# Patient Record
Sex: Male | Born: 1965 | Race: White | Hispanic: No | Marital: Single | Smoking: Never smoker
Health system: Southern US, Community
[De-identification: ages and names within clinical notes are randomized; demographics above are authoritative.]

## PROBLEM LIST (undated history)

## (undated) DIAGNOSIS — Z86010 Personal history of colonic polyps: Secondary | ICD-10-CM

## (undated) DIAGNOSIS — I10 Essential (primary) hypertension: Secondary | ICD-10-CM

## (undated) HISTORY — DX: Essential (primary) hypertension: I10

## (undated) HISTORY — PX: SHOULDER SURGERY: SHX246

## (undated) HISTORY — PX: COLONOSCOPY: SHX174

---
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
## 2018-09-16 ENCOUNTER — Telehealth: Payer: Self-pay | Admitting: Internal Medicine

## 2018-09-16 NOTE — Telephone Encounter (Signed)
I have looked at the records and timing of next colonoscopy depends upon family history    Please find out who in his family had colon cancer and at what age was it diagnosed (as best he can tell us)  Want to know as specific as he can but if cannot try to find out if relative was younger than 58's, in 66's or older than 51's  Thanks

## 2018-09-16 NOTE — Telephone Encounter (Signed)
Dr. Carlean Purl, pt was referred to you by his sister who is also your pt.  Pt has a family h/o colon cancer.  Previous colonoscopy report from [facility] from 2014 will be sent to you for review.

## 2018-09-17 ENCOUNTER — Encounter: Payer: Self-pay | Admitting: Internal Medicine

## 2018-09-17 DIAGNOSIS — Z800 Family history of malignant neoplasm of digestive organs: Secondary | ICD-10-CM | POA: Insufficient documentation

## 2018-09-17 NOTE — Telephone Encounter (Signed)
OK Schedule for direct colonoscopy please

## 2018-09-17 NOTE — Telephone Encounter (Signed)
I called the patient and he said that only his mother was diagnosed in her late 6's.

## 2018-09-26 ENCOUNTER — Ambulatory Visit: Payer: BC Managed Care – PPO | Admitting: *Deleted

## 2018-09-26 ENCOUNTER — Other Ambulatory Visit: Payer: Self-pay

## 2018-09-26 VITALS — Ht 73.0 in | Wt 230.0 lb

## 2018-09-26 DIAGNOSIS — Z800 Family history of malignant neoplasm of digestive organs: Secondary | ICD-10-CM

## 2018-09-26 NOTE — Progress Notes (Signed)
No egg or soy allergy known to patient  No issues with past sedation with any surgeries  or procedures, no intubation problems  No diet pills per patient No home 02 use per patient  No blood thinners per patient  Pt denies issues with constipation  No A fib or A flutter  EMMI information offered and declined Patient stating resting HR is low usually in the 40s.

## 2018-10-09 ENCOUNTER — Encounter: Payer: Self-pay | Admitting: Internal Medicine

## 2018-10-15 ENCOUNTER — Encounter: Payer: BC Managed Care – PPO | Admitting: Internal Medicine

## 2018-10-16 ENCOUNTER — Telehealth: Payer: Self-pay | Admitting: Internal Medicine

## 2018-10-16 NOTE — Telephone Encounter (Signed)

## 2018-10-17 ENCOUNTER — Other Ambulatory Visit: Payer: Self-pay

## 2018-10-17 ENCOUNTER — Encounter: Payer: Self-pay | Admitting: Internal Medicine

## 2018-10-17 ENCOUNTER — Ambulatory Visit (AMBULATORY_SURGERY_CENTER): Payer: BC Managed Care – PPO | Admitting: Internal Medicine

## 2018-10-17 VITALS — BP 115/76 | HR 42 | Temp 98.4°F | Resp 17 | Ht 73.0 in | Wt 230.0 lb

## 2018-10-17 DIAGNOSIS — Z1211 Encounter for screening for malignant neoplasm of colon: Secondary | ICD-10-CM

## 2018-10-17 DIAGNOSIS — D120 Benign neoplasm of cecum: Secondary | ICD-10-CM | POA: Diagnosis not present

## 2018-10-17 DIAGNOSIS — Z800 Family history of malignant neoplasm of digestive organs: Secondary | ICD-10-CM | POA: Diagnosis not present

## 2018-10-17 DIAGNOSIS — D124 Benign neoplasm of descending colon: Secondary | ICD-10-CM

## 2018-10-17 MED ORDER — SODIUM CHLORIDE 0.9 % IV SOLN: 500.0000 mL | Freq: Once | INTRAVENOUS | Status: DC

## 2018-10-17 NOTE — Progress Notes (Signed)
To PACU, VSS. Report to [email or web address].tb 

## 2018-10-17 NOTE — Op Note (Signed)
[facility] Patient Name: Ryan James Procedure Date: 10/17/2018 1:25 PM MRN: [id number] Endoscopist: Gatha Mayer , MD Age: 53 Referring MD:  Date of Birth: 1965/07/07 Gender: Male Account #: [id number] Procedure:                Colonoscopy Indications:              Screening in patient at increased risk: Colorectal                            cancer in mother before age 77 Medicines:                Propofol per Anesthesia, Monitored Anesthesia Care Procedure:                Pre-Anesthesia Assessment:                           - Prior to the procedure, a History and Physical                            was performed, and patient medications and                            allergies were reviewed. The patient's tolerance of                            previous anesthesia was also reviewed. The risks                            and benefits of the procedure and the sedation                            options and risks were discussed with the patient.                            All questions were answered, and informed consent                            was obtained. Prior Anticoagulants: The patient has                            taken no previous anticoagulant or antiplatelet                            agents. ASA Grade Assessment: II - A patient with                            mild systemic disease. After reviewing the risks                            and benefits, the patient was deemed in                            satisfactory condition to undergo the procedure.  After obtaining informed consent, the colonoscope                            was passed under direct vision. Throughout the                            procedure, the patient's blood pressure, pulse, and                            oxygen saturations were monitored continuously. The                            Colonoscope was introduced through the anus and   advanced to the the cecum, identified by                            appendiceal orifice and ileocecal valve. The                            colonoscopy was performed without difficulty. The                            patient tolerated the procedure well. The quality                            of the bowel preparation was good. The bowel                            preparation used was Miralax via split dose                            instruction. Scope In: 1:43:32 PM Scope Out: 1:59:40 PM Scope Withdrawal Time: 0 hours 12 minutes 24 seconds  Total Procedure Duration: 0 hours 16 minutes 8 seconds  Findings:                 The perianal and digital rectal examinations were                            normal. Pertinent negatives include normal prostate                            (size, shape, and consistency).                           Two sessile polyps were found in the descending                            colon and cecum. The polyps were 1 to 2 mm in size.                            These polyps were removed with a cold biopsy                            forceps. Resection  and retrieval were complete.                            Verification of patient identification for the                            specimen was done. Estimated blood loss was minimal.                           Multiple diverticula were found in the sigmoid                            colon.                           Internal hemorrhoids were found.                           The exam was otherwise without abnormality on                            direct and retroflexion views. Complications:            No immediate complications. Estimated Blood Loss:     Estimated blood loss was minimal. Impression:               - Two 1 to 2 mm polyps in the descending colon and                            in the cecum, removed with a cold biopsy forceps.                            Resected and retrieved.                           -  Diverticulosis in the sigmoid colon.                           - Internal hemorrhoids.                           - The examination was otherwise normal on direct                            and retroflexion views. He reports 1 polyp removed                            in past also - this is third colonoscopy Recommendation:           - Patient has a contact number available for                            emergencies. The signs and symptoms of potential                            delayed complications were discussed with the  patient. Return to normal activities tomorrow.                            Written discharge instructions were provided to the                            patient.                           - Resume previous diet.                           - Continue present medications.                           - Repeat colonoscopy is recommended for                            surveillance. The colonoscopy date will be                            determined after pathology results from today's                            exam become available for review. Gatha Mayer, MD 10/17/2018 2:12:09 PM This report has been signed electronically.

## 2018-10-17 NOTE — Patient Instructions (Addendum)
I found and removed 2 tiny polyps. They are not cancer but I will prove that - pathology will test them.  You also have internal hemorrhoids  And divertculosis. Common findings that are not usually a ahealth problem.  I appreciate the opportunity to care for you. Gatha Mayer, MD, [facility]  Handouts given for hemorrhoids, diverticulosis and polyps.   YOU HAD AN ENDOSCOPIC PROCEDURE TODAY AT [facility]:   Refer to the procedure report that was given to you for any specific questions about what was found during the examination.  If the procedure report does not answer your questions, please call your gastroenterologist to clarify.  If you requested that your care partner not be given the details of your procedure findings, then the procedure report has been included in a sealed envelope for you to review at your convenience later.  YOU SHOULD EXPECT: Some feelings of bloating in the abdomen. Passage of more gas than usual.  Walking can help get rid of the air that was put into your GI tract during the procedure and reduce the bloating. If you had a lower endoscopy (such as a colonoscopy or flexible sigmoidoscopy) you may notice spotting of blood in your stool or on the toilet paper. If you underwent a bowel prep for your procedure, you may not have a normal bowel movement for a few days.  Please Note:  You might notice some irritation and congestion in your nose or some drainage.  This is from the oxygen used during your procedure.  There is no need for concern and it should clear up in a day or so.  SYMPTOMS TO REPORT IMMEDIATELY:   Following lower endoscopy (colonoscopy or flexible sigmoidoscopy):  Excessive amounts of blood in the stool  Significant tenderness or worsening of abdominal pains  Swelling of the abdomen that is new, acute  Fever of 100F or higher  For urgent or emergent issues, a gastroenterologist can be reached at any hour by calling [phone]  [phone].   DIET:  We do recommend a small meal at first, but then you may proceed to your regular diet.  Drink plenty of fluids but you should avoid alcoholic beverages for 24 hours.  ACTIVITY:  You should plan to take it easy for the rest of today and you should NOT DRIVE or use heavy machinery until tomorrow (because of the sedation medicines used during the test).    FOLLOW UP: Our staff will call the number listed on your records 48-72 hours following your procedure to check on you and address any questions or concerns that you may have regarding the information given to you following your procedure. If we do not reach you, we will leave a message.  We will attempt to reach you two times.  During this call, we will ask if you have developed any symptoms of COVID 19. If you develop any symptoms (ie: fever, flu-like symptoms, shortness of breath, cough etc.) before then, please call [phone].  If you test positive for Covid 19 in the 2 weeks post procedure, please call and report this information to [place].    If any biopsies were taken you will be contacted by phone or by letter within the next 1-3 weeks.  Please call us at [phone] if you have not heard about the biopsies in 3 weeks.    SIGNATURES/CONFIDENTIALITY: You and/or your care partner have signed paperwork which will be entered into your electronic medical record.  These signatures attest  to the fact that that the information above on your After Visit Summary has been reviewed and is understood.  Full responsibility of the confidentiality of this discharge information lies with you and/or your care-partner. 

## 2018-10-17 NOTE — Progress Notes (Signed)
Called to room to assist during endoscopic procedure.  Patient ID and intended procedure confirmed with present staff. Received instructions for my participation in the procedure from the performing physician.  

## 2018-10-17 NOTE — Progress Notes (Signed)
Temp [place] Vitals CW

## 2018-10-21 ENCOUNTER — Telehealth: Payer: Self-pay

## 2018-10-21 NOTE — Telephone Encounter (Signed)
  Follow up Call-  Call back number 10/17/2018  Post procedure Call Back phone  # [phone]  Permission to leave phone message Yes     Patient questions:  Do you have a fever, pain , or abdominal swelling? No. Pain Score  0 *  Have you tolerated food without any problems? Yes.    Have you been able to return to your normal activities? Yes.    Do you have any questions about your discharge instructions: Diet   No. Medications  No. Follow up visit  No.  Do you have questions or concerns about your Care? No.  Actions: * If pain score is 4 or above: No action needed, pain <4. 1. Have you developed a fever since your procedure? no  2.   Have you had an respiratory symptoms (SOB or cough) since your procedure? no  3.   Have you tested positive for COVID 19 since your procedure no  4.   Have you had any family members/close contacts diagnosed with the COVID 19 since your procedure?  no   If yes to any of these questions please route to Joylene John, RN and Alphonsa Gin, Therapist, sports.

## 2018-10-25 ENCOUNTER — Encounter: Payer: Self-pay | Admitting: Internal Medicine

## 2018-10-25 DIAGNOSIS — Z86010 Personal history of colonic polyps: Secondary | ICD-10-CM

## 2018-10-25 DIAGNOSIS — Z860101 Personal history of adenomatous and serrated colon polyps: Secondary | ICD-10-CM

## 2018-10-25 HISTORY — DX: Personal history of colonic polyps: Z86.010

## 2018-10-25 HISTORY — DX: Personal history of adenomatous and serrated colon polyps: Z86.0101

## 2018-10-25 NOTE — Progress Notes (Signed)
1 diminutive adenoma Recall 2025 (also FHx CRCA)

## 2019-07-30 ENCOUNTER — Ambulatory Visit: Payer: BC Managed Care – PPO | Admitting: Family Medicine

## 2019-08-05 ENCOUNTER — Other Ambulatory Visit: Payer: Self-pay

## 2019-08-06 ENCOUNTER — Encounter: Payer: Self-pay | Admitting: Family Medicine

## 2019-08-06 ENCOUNTER — Ambulatory Visit: Payer: BC Managed Care – PPO | Admitting: Family Medicine

## 2019-08-06 VITALS — BP 120/76 | HR 53 | Temp 98.5°F | Ht 73.0 in | Wt 231.6 lb

## 2019-08-06 DIAGNOSIS — Z0000 Encounter for general adult medical examination without abnormal findings: Secondary | ICD-10-CM | POA: Diagnosis not present

## 2019-08-06 DIAGNOSIS — I10 Essential (primary) hypertension: Secondary | ICD-10-CM | POA: Insufficient documentation

## 2019-08-06 DIAGNOSIS — Z23 Encounter for immunization: Secondary | ICD-10-CM | POA: Diagnosis not present

## 2019-08-06 DIAGNOSIS — B009 Herpesviral infection, unspecified: Secondary | ICD-10-CM | POA: Diagnosis not present

## 2019-08-06 MED ORDER — VALACYCLOVIR HCL 1 G PO TABS: 1000.0000 mg | ORAL_TABLET | Freq: Every day | ORAL | 3 refills | Status: DC

## 2019-08-06 NOTE — Progress Notes (Signed)
New Patient Office Visit  Subjective:  Patient ID: Ryan James, male    DOB: 09/21/1965  Age: 54 y.o. MRN: [id number]  CC:  Chief Complaint  Patient presents with  . Establish Care    New patient, no concerns.     HPI Ryan James presents for establishment of care and follow-up of his hypertension and herpes simplex.  Blood pressures been well controlled with the daily Norvasc.  He is having no issues taking it.  He does have a history of bradycardia and denies lightheadedness shortness of breath or syncope.  History of herpes simplex.  Takes Valtrex daily for suppression.  History of adenomatous colon polyps.  Colonoscopy last year was normal he was advised to return in 5 years.  Mom died from colon cancer.  At one point she had been in the hospital for 3 years.  Expresses reluctance for the Covid vaccine.  Past Medical History:  Diagnosis Date  . Hx of adenomatous colonic polyps 10/25/2018  . Hypertension     Past Surgical History:  Procedure Laterality Date  . COLONOSCOPY    . SHOULDER SURGERY      Family History  Problem Relation Age of Onset  . Colon cancer Mother 64  . Colon polyps Neg Hx   . Esophageal cancer Neg Hx   . Rectal cancer Neg Hx   . Stomach cancer Neg Hx     Social History   Socioeconomic History  . Marital status: Single    Spouse name: Not on file  . Number of children: Not on file  . Years of education: Not on file  . Highest education level: Not on file  Occupational History    Employer: [employer]  Tobacco Use  . Smoking status: Never Smoker  . Smokeless tobacco: Never Used  Substance and Sexual Activity  . Alcohol use: Yes    Alcohol/week: 6.0 standard drinks    Types: 6 Cans of beer per week    Comment: occasionally  . Drug use: Never  . Sexual activity: Not on file  Other Topics Concern  . Not on file  Social History Narrative  . Not on file   Social Determinants of Health   Financial Resource Strain:     . Difficulty of Paying Living Expenses:   Food Insecurity:   . Worried About Charity fundraiser in the Last Year:   . Arboriculturist in the Last Year:   Transportation Needs:   . Film/video editor (Medical):   Marland Kitchen Lack of Transportation (Non-Medical):   Physical Activity:   . Days of Exercise per Week:   . Minutes of Exercise per Session:   Stress:   . Feeling of Stress :   Social Connections:   . Frequency of Communication with Friends and Family:   . Frequency of Social Gatherings with Friends and Family:   . Attends Religious Services:   . Active Member of Clubs or Organizations:   . Attends Archivist Meetings:   Marland Kitchen Marital Status:   Intimate Partner Violence:   . Fear of Current or Ex-Partner:   . Emotionally Abused:   Marland Kitchen Physically Abused:   . Sexually Abused:     ROS Review of Systems  Constitutional: Negative.   HENT: Negative.   Eyes: Negative for photophobia and visual disturbance.  Respiratory: Negative.   Cardiovascular: Negative.  Negative for palpitations.  Gastrointestinal: Negative.   Endocrine: Negative for polyphagia and polyuria.  Genitourinary: Negative.  Musculoskeletal: Negative for gait problem and joint swelling.  Skin: Negative.   Allergic/Immunologic: Negative for immunocompromised state.  Neurological: Negative for light-headedness.  Hematological: Does not bruise/bleed easily.  Psychiatric/Behavioral: Negative.     Objective:   Today's Vitals: BP 120/76   Pulse (!) 53   Temp 98.5 F (36.9 C) (Tympanic)   Ht 6\' 1"  (1.854 m)   Wt 231 lb 9.6 oz (105.1 kg)   SpO2 95%   BMI 30.56 kg/m   Physical Exam Constitutional:      General: He is not in acute distress.    Appearance: Normal appearance. He is not ill-appearing or toxic-appearing.  HENT:     Head: Normocephalic and atraumatic.     Right Ear: Tympanic membrane, ear canal and external ear normal.     Left Ear: Tympanic membrane, ear canal and external ear normal.   Eyes:     General:        Right eye: No discharge.        Left eye: No discharge.     Extraocular Movements: Extraocular movements intact.     Conjunctiva/sclera: Conjunctivae normal.     Pupils: Pupils are equal, round, and reactive to light.  Cardiovascular:     Rate and Rhythm: Normal rate and regular rhythm.  Pulmonary:     Effort: Pulmonary effort is normal.     Breath sounds: Normal breath sounds.  Musculoskeletal:     Cervical back: No rigidity or tenderness.  Lymphadenopathy:     Cervical: No cervical adenopathy.  Neurological:     Mental Status: He is alert and oriented to person, place, and time.  Psychiatric:        Mood and Affect: Mood normal.        Behavior: Behavior normal.     Assessment & Plan:   Problem List Items Addressed This Visit      Cardiovascular and Mediastinum   Essential hypertension   Relevant Orders   CBC   Comprehensive metabolic panel     Other   Herpes simplex   Relevant Medications   valACYclovir (VALTREX) 1000 MG tablet   Need for Tdap vaccination - Primary   Relevant Orders   Tdap vaccine greater than or equal to 7yo IM (Completed)    Other Visit Diagnoses    Healthcare maintenance       Relevant Orders   HIV Antibody (routine testing w rflx)   Hepatitis C antibody      Outpatient Encounter Medications as of 08/06/2019  Medication Sig  . amLODipine (NORVASC) 10 MG tablet Take 10 mg by mouth daily.  . valACYclovir (VALTREX) 1000 MG tablet Take 1 tablet (1,000 mg total) by mouth daily.  . [DISCONTINUED] valACYclovir (VALTREX) 1000 MG tablet TAKE 1 TABLET BY MOUTH EVERY 12 HOURS FOR 7 DAYS   No facility-administered encounter medications on file as of 08/06/2019.    Follow-up: Return in about 6 months (around 02/05/2020), or return fasting for physical.. Encouraged patient to reconsider the Covid vaccine and was given information on preventing disease through immunization. Libby Maw, MD

## 2019-08-06 NOTE — Patient Instructions (Addendum)
Preventing Disease Through Immunization Immunization means developing a lower risk of getting a disease due to improvements in the body's disease-fighting system (immune system). Immunization can happen through:  Natural exposure to a disease.  Getting shots (vaccination). Vaccination involves putting a small amount of germs (vaccines) into the body. This may be done through one or more shots. Some vaccines can be given by mouth or as a nasal spray, instead of a shot. Vaccination helps to prevent:  Serious diseases such as polio, measles, and whooping cough.  Common infections, such as the flu. Vaccination starts at birth. Teens and adults also need vaccines regularly. Talk with your health care provider about the immunization schedule that is best for you. Some vaccines need to be repeated when you are older. How does immunization prevent disease? Immunization occurs when the body is exposed to germs that cause a certain disease. The body responds to this exposure by forming proteins (antibodies) to fight those germs. Germs in vaccines are dead or very weak, so they will not make you sick. However, the antibodies that your body makes will stay in your body for a long time. This improves the ability of your immune system to fight the germs in the future. If you get exposed to the germs again, you may be able to resist them (develop immunity against them). This is because your antibodies may be able to destroy the germs before you get sick. Why should I prevent diseases through immunization? Vaccines can protect you from getting diseases that can cause harmful complications and even death. Getting vaccinated also helps to keep other people healthy. If you are vaccinated, you cannot spread disease to others, and that can make the disease become less common. If people keep getting vaccinated, certain diseases may become rare or go away. If people stop getting vaccinated, certain diseases could become  more common. Not everyone can get a vaccine. Very young babies, people who are very sick, or older people may not be able to get vaccines. By getting immunized, you help to protect people who are not able to be vaccinated. Where to find more information To learn more about immunization, visit:  World Health Organization: www.who.int/topics/immunization/en  Centers for Disease Control and Prevention: www.cdc.gov/vaccines/index.html Summary  Immunization occurs when the body is exposed to germs that cause a certain disease and responds by forming proteins (antibodies) to fight those germs.  Getting vaccines is a safe and effective way to develop immunity against specific germs and the diseases that they cause.  Talk with your health care provider about your immunization schedule, and stay up to date with all of your shots. This information is not intended to replace advice given to you by your health care provider. Make sure you discuss any questions you have with your health care provider. Document Revised: 07/18/2018 Document Reviewed: 12/03/2015 Elsevier Patient Education  2020 [employer].  

## 2019-08-07 LAB — CBC
HCT: 46.1 % (ref 39.0–52.0)
Hemoglobin: 15.7 g/dL (ref 13.0–17.0)
MCHC: 34 g/dL (ref 30.0–36.0)
MCV: 92.5 fl (ref 78.0–100.0)
Platelets: 159 10*3/uL (ref 150.0–400.0)
RBC: 4.99 Mil/uL (ref 4.22–5.81)
RDW: 12.9 % (ref 11.5–15.5)
WBC: 7.6 10*3/uL (ref 4.0–10.5)

## 2019-08-07 LAB — COMPREHENSIVE METABOLIC PANEL
ALT: 34 U/L (ref 0–53)
AST: 29 U/L (ref 0–37)
Albumin: 4.4 g/dL (ref 3.5–5.2)
Alkaline Phosphatase: 42 U/L (ref 39–117)
BUN: 26 mg/dL — ABNORMAL HIGH (ref 6–23)
CO2: 27 mEq/L (ref 19–32)
Calcium: 9.2 mg/dL (ref 8.4–10.5)
Chloride: 105 mEq/L (ref 96–112)
Creatinine, Ser: 1.17 mg/dL (ref 0.40–1.50)
GFR: 64.44 mL/min (ref >60.00)
Glucose, Bld: 97 mg/dL (ref 70–99)
Potassium: 4 mEq/L (ref 3.5–5.1)
Sodium: 139 mEq/L (ref 135–145)
Total Bilirubin: 0.6 mg/dL (ref 0.2–1.2)
Total Protein: 6.9 g/dL (ref 6.0–8.3)

## 2019-08-10 LAB — HCV RNA,QUANTITATIVE REAL TIME PCR
HCV Quantitative Log: <1.18 Log IU/mL
HCV RNA, PCR, QN: <15 IU/mL

## 2019-08-10 LAB — HIV ANTIBODY (ROUTINE TESTING W REFLEX): HIV 1&2 Ab, 4th Generation: NONREACTIVE

## 2019-08-10 LAB — HEPATITIS C ANTIBODY
Hepatitis C Ab: REACTIVE — AB
SIGNAL TO CUT-OFF: 25.4 — ABNORMAL HIGH (ref <1.00)

## 2020-02-05 ENCOUNTER — Encounter: Payer: BC Managed Care – PPO | Admitting: Family Medicine

## 2020-02-08 ENCOUNTER — Encounter: Payer: BC Managed Care – PPO | Admitting: Family Medicine

## 2020-02-12 ENCOUNTER — Telehealth: Payer: Self-pay

## 2020-02-12 MED ORDER — AMLODIPINE BESYLATE 10 MG PO TABS: 10.0000 mg | ORAL_TABLET | Freq: Every day | ORAL | 0 refills | Status: DC

## 2020-02-12 NOTE — Telephone Encounter (Signed)
Pt calling to ask for Rx to be sent to different pharmacy.

## 2020-04-07 ENCOUNTER — Encounter: Payer: BC Managed Care – PPO | Admitting: Family Medicine

## 2020-05-09 ENCOUNTER — Encounter: Payer: BC Managed Care – PPO | Admitting: Family Medicine

## 2020-05-23 ENCOUNTER — Telehealth: Payer: Self-pay | Admitting: Family Medicine

## 2020-05-23 ENCOUNTER — Other Ambulatory Visit: Payer: Self-pay | Admitting: Family Medicine

## 2020-05-23 NOTE — Telephone Encounter (Signed)
Pt calling wanting to know if he can get a refill on amlodipine to last him until his CPE, He has 2 pills left

## 2020-05-24 NOTE — Telephone Encounter (Signed)
Rx sent in pt aware 

## 2020-07-15 ENCOUNTER — Other Ambulatory Visit: Payer: Self-pay

## 2020-07-18 ENCOUNTER — Other Ambulatory Visit: Payer: Self-pay

## 2020-07-18 ENCOUNTER — Encounter: Payer: Self-pay | Admitting: Family Medicine

## 2020-07-18 ENCOUNTER — Ambulatory Visit (INDEPENDENT_AMBULATORY_CARE_PROVIDER_SITE_OTHER): Payer: BC Managed Care – PPO | Admitting: Family Medicine

## 2020-07-18 VITALS — BP 126/78 | HR 54 | Temp 97.5°F | Ht 73.0 in | Wt 233.6 lb

## 2020-07-18 DIAGNOSIS — B009 Herpesviral infection, unspecified: Secondary | ICD-10-CM | POA: Diagnosis not present

## 2020-07-18 DIAGNOSIS — Z0000 Encounter for general adult medical examination without abnormal findings: Secondary | ICD-10-CM | POA: Diagnosis not present

## 2020-07-18 DIAGNOSIS — R6882 Decreased libido: Secondary | ICD-10-CM

## 2020-07-18 DIAGNOSIS — I10 Essential (primary) hypertension: Secondary | ICD-10-CM

## 2020-07-18 MED ORDER — VALACYCLOVIR HCL 1 G PO TABS: 1000.0000 mg | ORAL_TABLET | Freq: Every day | ORAL | 3 refills | Status: DC

## 2020-07-18 MED ORDER — AMLODIPINE BESYLATE 10 MG PO TABS: 1.0000 | ORAL_TABLET | Freq: Every day | ORAL | 3 refills | Status: DC

## 2020-07-18 NOTE — Patient Instructions (Signed)
Health Maintenance, Male Adopting a healthy lifestyle and getting preventive care are important in promoting health and wellness. Ask your health care provider about:  The right schedule for you to have regular tests and exams.  Things you can do on your own to prevent diseases and keep yourself healthy. What should I know about diet, weight, and exercise? Eat a healthy diet  Eat a diet that includes plenty of vegetables, fruits, low-fat dairy products, and lean protein.  Do not eat a lot of foods that are high in solid fats, added sugars, or sodium.   Maintain a healthy weight Body mass index (BMI) is a measurement that can be used to identify possible weight problems. It estimates body fat based on height and weight. Your health care provider can help determine your BMI and help you achieve or maintain a healthy weight. Get regular exercise Get regular exercise. This is one of the most important things you can do for your health. Most adults should:  Exercise for at least 150 minutes each week. The exercise should increase your heart rate and make you sweat (moderate-intensity exercise).  Do strengthening exercises at least twice a week. This is in addition to the moderate-intensity exercise.  Spend less time sitting. Even light physical activity can be beneficial. Watch cholesterol and blood lipids Have your blood tested for lipids and cholesterol at 57 years of age, then have this test every 5 years. You may need to have your cholesterol levels checked more often if:  Your lipid or cholesterol levels are high.  You are older than 57 years of age.  You are at high risk for heart disease. What should I know about cancer screening? Many types of cancers can be detected early and may often be prevented. Depending on your health history and family history, you may need to have cancer screening at various ages. This may include screening for:  Colorectal cancer.  Prostate  cancer.  Skin cancer.  Lung cancer. What should I know about heart disease, diabetes, and high blood pressure? Blood pressure and heart disease  High blood pressure causes heart disease and increases the risk of stroke. This is more likely to develop in people who have high blood pressure readings, are of African descent, or are overweight.  Talk with your health care provider about your target blood pressure readings.  Have your blood pressure checked: ? Every 3-5 years if you are 18-39 years of age. ? Every year if you are 40 years old or older.  If you are between the ages of 65 and 75 and are a current or former smoker, ask your health care provider if you should have a one-time screening for abdominal aortic aneurysm (AAA). Diabetes Have regular diabetes screenings. This checks your fasting blood sugar level. Have the screening done:  Once every three years after age 45 if you are at a normal weight and have a low risk for diabetes.  More often and at a younger age if you are overweight or have a high risk for diabetes. What should I know about preventing infection? Hepatitis B If you have a higher risk for hepatitis B, you should be screened for this virus. Talk with your health care provider to find out if you are at risk for hepatitis B infection. Hepatitis C Blood testing is recommended for:  Everyone born from 1945 through 1965.  Anyone with known risk factors for hepatitis C. Sexually transmitted infections (STIs)  You should be screened each   year for STIs, including gonorrhea and chlamydia, if: ? You are sexually active and are younger than 57 years of age. ? You are older than 57 years of age and your health care provider tells you that you are at risk for this type of infection. ? Your sexual activity has changed since you were last screened, and you are at increased risk for chlamydia or gonorrhea. Ask your health care provider if you are at risk.  Ask your  health care provider about whether you are at high risk for HIV. Your health care provider may recommend a prescription medicine to help prevent HIV infection. If you choose to take medicine to prevent HIV, you should first get tested for HIV. You should then be tested every 3 months for as long as you are taking the medicine. Follow these instructions at home: Lifestyle  Do not use any products that contain nicotine or tobacco, such as cigarettes, e-cigarettes, and chewing tobacco. If you need help quitting, ask your health care provider.  Do not use street drugs.  Do not share needles.  Ask your health care provider for help if you need support or information about quitting drugs. Alcohol use  Do not drink alcohol if your health care provider tells you not to drink.  If you drink alcohol: ? Limit how much you have to 0-2 drinks a day. ? Be aware of how much alcohol is in your drink. In the [place] one drink equals one 12 oz bottle of beer (355 mL), one 5 oz glass of wine (148 mL), or one 1 oz glass of hard liquor (44 mL). General instructions  Schedule regular health, dental, and eye exams.  Stay current with your vaccines.  Tell your health care provider if: ? You often feel depressed. ? You have ever been abused or do not feel safe at home. Summary  Adopting a healthy lifestyle and getting preventive care are important in promoting health and wellness.  Follow your health care provider's instructions about healthy diet, exercising, and getting tested or screened for diseases.  Follow your health care provider's instructions on monitoring your cholesterol and blood pressure. This information is not intended to replace advice given to you by your health care provider. Make sure you discuss any questions you have with your health care provider. Document Revised: 03/19/2018 Document Reviewed: 03/19/2018 Elsevier Patient Education  2021 [place] Years  Old, Male Preventive care refers to lifestyle choices and visits with your health care provider that can promote health and wellness. This includes:  A yearly physical exam. This is also called an annual wellness visit.  Regular dental and eye exams.  Immunizations.  Screening for certain conditions.  Healthy lifestyle choices, such as: ? Eating a healthy diet. ? Getting regular exercise. ? Not using drugs or products that contain nicotine and tobacco. ? Limiting alcohol use. What can I expect for my preventive care visit? Physical exam Your health care provider will check your:  Height and weight. These may be used to calculate your BMI (body mass index). BMI is a measurement that tells if you are at a healthy weight.  Heart rate and blood pressure.  Body temperature.  Skin for abnormal spots. Counseling Your health care provider may ask you questions about your:  Past medical problems.  Family's medical history.  Alcohol, tobacco, and drug use.  Emotional well-being.  Home life and relationship well-being.  Sexual activity.  Diet, exercise, and sleep habits.  Work and work Statistician.  Access to firearms. What immunizations do I need? Vaccines are usually given at various ages, according to a schedule. Your health care provider will recommend vaccines for you based on your age, medical history, and lifestyle or other factors, such as travel or where you work.   What tests do I need? Blood tests  Lipid and cholesterol levels. These may be checked every 5 years, or more often if you are over 44 years old.  Hepatitis C test.  Hepatitis B test. Screening  Lung cancer screening. You may have this screening every year starting at age 77 if you have a 30-pack-year history of smoking and currently smoke or have quit within the past 15 years.  Prostate cancer screening. Recommendations will vary depending on your family history and other risks.  Genital exam  to check for testicular cancer or hernias.  Colorectal cancer screening. ? All adults should have this screening starting at age 36 and continuing until age 28. ? Your health care provider may recommend screening at age 16 if you are at increased risk. ? You will have tests every 1-10 years, depending on your results and the type of screening test.  Diabetes screening. ? This is done by checking your blood sugar (glucose) after you have not eaten for a while (fasting). ? You may have this done every 1-3 years.  STD (sexually transmitted disease) testing, if you are at risk. Follow these instructions at home: Eating and drinking  Eat a diet that includes fresh fruits and vegetables, whole grains, lean protein, and low-fat dairy products.  Take vitamin and mineral supplements as recommended by your health care provider.  Do not drink alcohol if your health care provider tells you not to drink.  If you drink alcohol: ? Limit how much you have to 0-2 drinks a day. ? Be aware of how much alcohol is in your drink. In the [place] one drink equals one 12 oz bottle of beer (355 mL), one 5 oz glass of wine (148 mL), or one 1 oz glass of hard liquor (44 mL).   Lifestyle  Take daily care of your teeth and gums. Brush your teeth every morning and night with fluoride toothpaste. Floss one time each day.  Stay active. Exercise for at least 30 minutes 5 or more days each week.  Do not use any products that contain nicotine or tobacco, such as cigarettes, e-cigarettes, and chewing tobacco. If you need help quitting, ask your health care provider.  Do not use drugs.  If you are sexually active, practice safe sex. Use a condom or other form of protection to prevent STIs (sexually transmitted infections).  If told by your health care provider, take low-dose aspirin daily starting at age 10.  Find healthy ways to cope with stress, such as: ? Meditation, yoga, or listening to  music. ? Journaling. ? Talking to a trusted person. ? Spending time with friends and family. Safety  Always wear your seat belt while driving or riding in a vehicle.  Do not drive: ? If you have been drinking alcohol. Do not ride with someone who has been drinking. ? When you are tired or distracted. ? While texting.  Wear a helmet and other protective equipment during sports activities.  If you have firearms in your house, make sure you follow all gun safety procedures. What's next?  Go to your health care provider once a year for an annual wellness visit.  Ask your health  care provider how often you should have your eyes and teeth checked.  Stay up to date on all vaccines. This information is not intended to replace advice given to you by your health care provider. Make sure you discuss any questions you have with your health care provider. Document Revised: 12/23/2018 Document Reviewed: 03/20/2018 Elsevier Patient Education  2021 [employer] [facility] (11th ed., pp. [phone]). [place], [place]: Elsevier.">  Testosterone Replacement Therapy Testosterone replacement therapy (TRT) treats men who have a low testosterone level. Testosterone is a male hormone that is produced in the testicles. It is responsible for typical male characteristics and for maintaining a man's sex drive and ability to get an erection. Testosterone also supports bone and muscle health. Low testosterone may not need to be treated. Your health care provider may recommend TRT if you have symptoms such as a low sex drive or erection problems, weak muscles or bones, or low energy. Types of TRT You and your health care provider will decide which form is best for you. TRT is available in the following forms:  Topical gels, creams, lotions, or sprays. Do not let other people, especially women or children, come in contact with the skin where the testosterone was applied.  Nasal  gels.  Patches.  Pills.  Injections into the muscle or under the skin.  Long-acting pellets inserted under the skin. The amount of TRT you take and how long you take it is based on your condition. It is important to:  Begin TRT with the lowest possible dosage.  Stop TRT if your health care provider tells you to stop.  Work with your health care provider so that you feel informed and comfortable with your decision.   Tell a health care provider about:  Any allergies you have.  Any personal or family history of blood clots, breast cancer, or prostate cancer.  If you have sleep apnea or have been told that you snore.  All medicines you are taking, including vitamins, herbs, eye drops, creams, and over-the-counter medicines.  Any surgeries you have had.  Any other medical conditions you have.  If you wish to have biological children someday. What are the benefits? Benefits of TRT vary but may include improved sexual function, muscle mass or strength, mood, or quality of life. What are the risks? Risks of TRT vary depending on your individual and medical history. Side effects can be related to the type of TRT you choose. If you choose products that are used on the skin, you may have skin irritation. If you get injections, you may have redness and swelling at the injection site. Side effects that can happen with any form of TRT include:  Lower sperm count.  Acne.  Swelling of your legs or feet, or tenderness in the chest or breast area.  Sleep disturbances and mood swings.  Enlarged prostate.  Increase in your red blood count. It is unclear if testosterone increases the risk of some serious conditions. Talk with your health care provider about your risk for these conditions, including:  Blood clots.  Heart disease, such as stroke or heart attack.  Prostate cancer. Risks of TRT may increase if you:  Have had or are at risk for prostate cancer or breast  cancer.  Have had a previous stroke or heart attack.  Have a high number of red blood cells.  Have treated or untreated sleep apnea.  Have a large prostate. The long-term safety of TRT is not known. Follow these instructions  at home:  Take over-the-counter and prescription medicines only as told by your health care provider.  Lose weight if you are overweight. Ask your health care provider to help you start a healthy diet and exercise program to reach and maintain a healthy weight.  Work with your health care provider to manage other medical conditions that may lower your testosterone. These include obesity, high blood pressure, high cholesterol, diabetes, liver disease, kidney disease, and sleep apnea.  Do not use any testosterone replacement therapies that are not prescribed by your health care provider.  Keep all follow-up visits. This is important. Where to find more information  [place]: www.urologyhealth.org  Endocrine Society: www.hormone.org Contact a health care provider if:  You have side effects from your TRT.  You have pain or swelling in your legs.  You have problems urinating.  You have lumps or changes in your breasts or armpits. Get help right away if:  You have shortness of breath.  You have chest pain.  You have slurred speech.  You have weakness or numbness in any part of your arms or legs. These symptoms may represent a serious problem that is an emergency. Do not wait to see if the symptoms will go away. Get medical help right away. Call your local emergency services (911 in the [place] Do not drive yourself to the hospital. Summary  Testosterone replacement therapy (TRT) is used to treat men who have a low testosterone level.  TRT should only be prescribed by and under the supervision of a health care provider.  Tell a health care provider about any medical conditions you have.  TRT may have side effects.  Talk with  your health care provider about all of the risks and benefits before you start therapy. This information is not intended to replace advice given to you by your health care provider. Make sure you discuss any questions you have with your health care provider. Document Revised: 01/19/2020 Document Reviewed: 01/19/2020 Elsevier Patient Education  2021 [employer].

## 2020-07-18 NOTE — Progress Notes (Signed)
Established Patient Office Visit  Subjective:  Patient ID: Ryan James, male    DOB: [birth year]  Age: 55 y.o. MRN: [id number]  CC:  Chief Complaint  Patient presents with  . Annual Exam    CPE, patient would like testosterone levels checked.     HPI Ryan James presents for physical exam and follow-up for his hypertension and herpes suppression.  Continues amlodipine and Cyclovir respectively without issue.  Continues to lead a healthy lifestyle he exercises by riding bikes and plans on returning to the gym.  He has noticed drop in his libido.  No issues with ED.  No issues with urine flow.  Recent colonoscopy in 2020 showed 1 small polyp.  He was asked to return in 5 years.  History adenomatous polyps with a positive family history of colon cancer with his mom.  Past Medical History:  Diagnosis Date  . Hx of adenomatous colonic polyps 10/25/2018  . Hypertension     Past Surgical History:  Procedure Laterality Date  . COLONOSCOPY    . SHOULDER SURGERY      Family History  Problem Relation Age of Onset  . Colon cancer Mother 82  . Colon polyps Neg Hx   . Esophageal cancer Neg Hx   . Rectal cancer Neg Hx   . Stomach cancer Neg Hx     Social History   Socioeconomic History  . Marital status: Single    Spouse name: Not on file  . Number of children: Not on file  . Years of education: Not on file  . Highest education level: Not on file  Occupational History    Employer: [employer]  Tobacco Use  . Smoking status: Never Smoker  . Smokeless tobacco: Never Used  Vaping Use  . Vaping Use: Never used  Substance and Sexual Activity  . Alcohol use: Yes    Alcohol/week: 6.0 standard drinks    Types: 6 Cans of beer per week    Comment: occasionally  . Drug use: Never  . Sexual activity: Not on file  Other Topics Concern  . Not on file  Social History Narrative  . Not on file   Social Determinants of Health   Financial Resource Strain: Not on  file  Food Insecurity: Not on file  Transportation Needs: Not on file  Physical Activity: Not on file  Stress: Not on file  Social Connections: Not on file  Intimate Partner Violence: Not on file    Outpatient Medications Prior to Visit  Medication Sig Dispense Refill  . amLODipine (NORVASC) 10 MG tablet TAKE ONE TABLET BY MOUTH ONE TIME DAILY 90 tablet 0  . valACYclovir (VALTREX) 1000 MG tablet Take 1 tablet (1,000 mg total) by mouth daily. 90 tablet 3   No facility-administered medications prior to visit.    No Known Allergies  ROS Review of Systems  Constitutional: Negative.   HENT: Negative.   Eyes: Negative for photophobia and visual disturbance.  Respiratory: Negative.   Cardiovascular: Negative.   Endocrine: Negative for polyphagia and polyuria.  Genitourinary: Negative for difficulty urinating, frequency and urgency.  Musculoskeletal: Negative.   Skin: Negative for pallor and rash.  Allergic/Immunologic: Negative for immunocompromised state.  Neurological: Negative for speech difficulty and weakness.  Hematological: Does not bruise/bleed easily.  Psychiatric/Behavioral: Negative.       Objective:    Physical Exam Vitals and nursing note reviewed.  Constitutional:      General: He is not in acute distress.  Appearance: Normal appearance. He is not ill-appearing, toxic-appearing or diaphoretic.  HENT:     Head: Normocephalic and atraumatic.     Right Ear: Tympanic membrane, ear canal and external ear normal.     Left Ear: Tympanic membrane, ear canal and external ear normal.     Mouth/Throat:     Mouth: Mucous membranes are moist.     Pharynx: Oropharynx is clear. No oropharyngeal exudate or posterior oropharyngeal erythema.  Eyes:     General: No scleral icterus.       Right eye: No discharge.        Left eye: No discharge.     Extraocular Movements: Extraocular movements intact.     Conjunctiva/sclera: Conjunctivae normal.     Pupils: Pupils are  equal, round, and reactive to light.  Cardiovascular:     Rate and Rhythm: Normal rate and regular rhythm.  Pulmonary:     Effort: Pulmonary effort is normal.     Breath sounds: Normal breath sounds.  Abdominal:     General: Abdomen is flat. Bowel sounds are normal. There is no distension.     Palpations: There is no mass.     Tenderness: There is no abdominal tenderness. There is no guarding or rebound.     Hernia: A hernia is present. Hernia is present in the umbilical area. There is no hernia in the left inguinal area or right inguinal area.  Genitourinary:    Penis: Normal and circumcised. No hypospadias, erythema, tenderness, discharge, swelling or lesions.      Testes:        Right: Mass, tenderness or swelling not present. Right testis is descended.        Left: Tenderness or swelling not present. Left testis is descended.     Epididymis:     Right: Not inflamed.     Left: Not inflamed.     Prostate: Enlarged. Not tender and no nodules present.     Rectum: Guaiac result negative. No mass, tenderness, anal fissure, external hemorrhoid or internal hemorrhoid. Normal anal tone.  Musculoskeletal:     Cervical back: No rigidity or tenderness.  Lymphadenopathy:     Cervical: No cervical adenopathy.     Lower Body: No right inguinal adenopathy. No left inguinal adenopathy.  Skin:    General: Skin is warm and dry.  Neurological:     Mental Status: He is alert and oriented to person, place, and time.  Psychiatric:        Mood and Affect: Mood normal.        Behavior: Behavior normal.     BP 126/78   Pulse (!) 54   Temp (!) 97.5 F (36.4 C) (Temporal)   Ht 6\' 1"  (1.854 m)   Wt 233 lb 9.6 oz (106 kg)   SpO2 96%   BMI 30.82 kg/m  Wt Readings from Last 3 Encounters:  07/18/20 233 lb 9.6 oz (106 kg)  08/06/19 231 lb 9.6 oz (105.1 kg)  10/17/18 230 lb (104.3 kg)     There are no preventive care reminders to display for this patient.  There are no preventive care  reminders to display for this patient.  No results found for: TSH Lab Results  Component Value Date   WBC 7.6 08/06/2019   HGB 15.7 08/06/2019   HCT 46.1 08/06/2019   MCV 92.5 08/06/2019   PLT 159.0 08/06/2019   Lab Results  Component Value Date   NA 139 08/06/2019   K 4.0 08/06/2019  CO2 27 08/06/2019   GLUCOSE 97 08/06/2019   BUN 26 (H) 08/06/2019   CREATININE 1.17 08/06/2019   BILITOT 0.6 08/06/2019   ALKPHOS 42 08/06/2019   AST 29 08/06/2019   ALT 34 08/06/2019   PROT 6.9 08/06/2019   ALBUMIN 4.4 08/06/2019   CALCIUM 9.2 08/06/2019   GFR 64.44 08/06/2019   No results found for: CHOL No results found for: HDL No results found for: LDLCALC No results found for: TRIG No results found for: CHOLHDL No results found for: HGBA1C    Assessment & Plan:   Problem List Items Addressed This Visit      Cardiovascular and Mediastinum   Essential hypertension - Primary   Relevant Medications   amLODipine (NORVASC) 10 MG tablet   Other Relevant Orders   Comprehensive metabolic panel   CBC     Other   Herpes simplex   Relevant Medications   valACYclovir (VALTREX) 1000 MG tablet    Other Visit Diagnoses    Healthcare maintenance       Relevant Orders   Lipid panel   Urinalysis, Routine w reflex microscopic   Libido, decreased       Relevant Orders   Testosterone   Testosterone Total,Free,Bio, Males-([facility])      Meds ordered this encounter  Medications  . amLODipine (NORVASC) 10 MG tablet    Sig: Take 1 tablet (10 mg total) by mouth daily.    Dispense:  90 tablet    Refill:  3  . valACYclovir (VALTREX) 1000 MG tablet    Sig: Take 1 tablet (1,000 mg total) by mouth daily.    Dispense:  90 tablet    Refill:  3    Follow-up: Return in about 6 months (around 01/17/2021), or if symptoms worsen or fail to improve.   Continue Norvasc for hypertension Valtrex herpes suppression.  Patient was given information on health maintenance and disease prevention.  Also  given information on testosterone replacement therapy. Libby Maw, MD

## 2020-07-19 LAB — COMPREHENSIVE METABOLIC PANEL
ALT: 33 U/L (ref 0–53)
AST: 28 U/L (ref 0–37)
Albumin: 4.2 g/dL (ref 3.5–5.2)
Alkaline Phosphatase: 46 U/L (ref 39–117)
BUN: 20 mg/dL (ref 6–23)
CO2: 27 mEq/L (ref 19–32)
Calcium: 9.5 mg/dL (ref 8.4–10.5)
Chloride: 104 mEq/L (ref 96–112)
Creatinine, Ser: 0.99 mg/dL (ref 0.40–1.50)
GFR: 84.78 mL/min (ref >60.00)
Glucose, Bld: 89 mg/dL (ref 70–99)
Potassium: 4.2 mEq/L (ref 3.5–5.1)
Sodium: 140 mEq/L (ref 135–145)
Total Bilirubin: 0.6 mg/dL (ref 0.2–1.2)
Total Protein: 7.3 g/dL (ref 6.0–8.3)

## 2020-07-19 LAB — CBC
HCT: 43.2 % (ref 39.0–52.0)
Hemoglobin: 14.4 g/dL (ref 13.0–17.0)
MCHC: 33.4 g/dL (ref 30.0–36.0)
MCV: 91.7 fl (ref 78.0–100.0)
Platelets: 150 10*3/uL (ref 150.0–400.0)
RBC: 4.72 Mil/uL (ref 4.22–5.81)
RDW: 13.7 % (ref 11.5–15.5)
WBC: 7.7 10*3/uL (ref 4.0–10.5)

## 2020-07-19 LAB — LIPID PANEL
Cholesterol: 176 mg/dL (ref 0–200)
HDL: 61.1 mg/dL (ref >39.00)
LDL Cholesterol: 94 mg/dL (ref 0–99)
NonHDL: 114.83
Total CHOL/HDL Ratio: 3
Triglycerides: 102 mg/dL (ref 0.0–149.0)
VLDL: 20.4 mg/dL (ref 0.0–40.0)

## 2020-07-19 LAB — URINALYSIS, ROUTINE W REFLEX MICROSCOPIC
Bilirubin Urine: NEGATIVE
Hgb urine dipstick: NEGATIVE
Ketones, ur: NEGATIVE
Leukocytes,Ua: NEGATIVE
Nitrite: NEGATIVE
RBC / HPF: NONE SEEN (ref 0–?)
Specific Gravity, Urine: 1.025 (ref 1.000–1.030)
Total Protein, Urine: NEGATIVE
Urine Glucose: NEGATIVE
Urobilinogen, UA: 0.2 (ref 0.0–1.0)
pH: 5 (ref 5.0–8.0)

## 2020-07-19 LAB — TESTOSTERONE TOTAL,FREE,BIO, MALES
Albumin: 4.2 g/dL (ref 3.6–5.1)
Sex Hormone Binding: 50 nmol/L (ref 22–77)
Testosterone, Bioavailable: 112.9 ng/dL (ref >=110.0)
Testosterone, Free: 58.6 pg/mL (ref 46.0–224.0)
Testosterone: 600 ng/dL (ref 250–827)

## 2020-07-19 LAB — TESTOSTERONE: Testosterone: 404.76 ng/dL (ref 300.00–890.00)

## 2020-07-24 ENCOUNTER — Emergency Department (HOSPITAL_COMMUNITY): Payer: BC Managed Care – PPO

## 2020-07-24 ENCOUNTER — Encounter (HOSPITAL_COMMUNITY): Payer: Self-pay | Admitting: Emergency Medicine

## 2020-07-24 ENCOUNTER — Emergency Department (HOSPITAL_COMMUNITY)
Admission: EM | Admit: 2020-07-24 | Discharge: 2020-07-24 | Disposition: A | Discharge status: Home / Self Care | Payer: BC Managed Care – PPO | Attending: Emergency Medicine | Admitting: Emergency Medicine

## 2020-07-24 ENCOUNTER — Other Ambulatory Visit: Payer: Self-pay

## 2020-07-24 DIAGNOSIS — Y9289 Other specified places as the place of occurrence of the external cause: Secondary | ICD-10-CM | POA: Insufficient documentation

## 2020-07-24 DIAGNOSIS — Z79899 Other long term (current) drug therapy: Secondary | ICD-10-CM | POA: Diagnosis not present

## 2020-07-24 DIAGNOSIS — S43102A Unspecified dislocation of left acromioclavicular joint, initial encounter: Secondary | ICD-10-CM | POA: Insufficient documentation

## 2020-07-24 DIAGNOSIS — I10 Essential (primary) hypertension: Secondary | ICD-10-CM | POA: Diagnosis not present

## 2020-07-24 DIAGNOSIS — S4992XA Unspecified injury of left shoulder and upper arm, initial encounter: Secondary | ICD-10-CM | POA: Diagnosis present

## 2020-07-24 MED ORDER — NAPROXEN 500 MG PO TABS: 500.0000 mg | ORAL_TABLET | Freq: Two times a day (BID) | ORAL | 0 refills | Status: DC

## 2020-07-24 MED ORDER — OXYCODONE-ACETAMINOPHEN 5-325 MG PO TABS
1.0000 | ORAL_TABLET | Freq: Once | ORAL | Status: AC
  Administered 2020-07-24: 1 via ORAL
  Filled 2020-07-24: qty 1

## 2020-07-24 MED ORDER — OXYCODONE-ACETAMINOPHEN 5-325 MG PO TABS: 1.0000 | ORAL_TABLET | Freq: Three times a day (TID) | ORAL | 0 refills | Status: DC | PRN
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
## 2020-07-24 NOTE — Discharge Instructions (Addendum)
Your x-ray shows that you have an [facility] joint separation injury. Wear the sling as directed. Take the pain medicine as needed. Follow-up with orthopedic specialist listed below. Return to the ER if you start to experience worsening pain, numbness or weakness or trouble breathing.
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
## 2020-07-24 NOTE — ED Notes (Signed)
Ortho Tech has responded & will be applying shoulder immobilizer momentarily.

## 2020-07-24 NOTE — Progress Notes (Signed)
Orthopedic Tech Progress Note Patient Details:  Abanoub Hanken 13-Sep-1965 [id number]  Ortho Devices Type of Ortho Device: Sling immobilizer Ortho Device/Splint Location: Left Upper Extremity Ortho Device/Splint Interventions: Ordered,Adjustment,Application   Post Interventions Patient Tolerated: Well Instructions Provided: Adjustment of device,Care of device,Poper ambulation with device   Donis Kotowski P Lorel Monaco 07/24/2020, 7:04 PM

## 2020-08-09 ENCOUNTER — Encounter: Payer: Self-pay | Admitting: Family Medicine

## 2020-08-09 ENCOUNTER — Telehealth (INDEPENDENT_AMBULATORY_CARE_PROVIDER_SITE_OTHER): Payer: BC Managed Care – PPO | Admitting: Family Medicine

## 2020-08-09 VITALS — Ht 73.0 in | Wt 231.0 lb

## 2020-08-09 DIAGNOSIS — N400 Enlarged prostate without lower urinary tract symptoms: Secondary | ICD-10-CM | POA: Diagnosis not present

## 2020-08-09 DIAGNOSIS — R6882 Decreased libido: Secondary | ICD-10-CM

## 2020-08-09 DIAGNOSIS — Z0000 Encounter for general adult medical examination without abnormal findings: Secondary | ICD-10-CM

## 2020-08-09 NOTE — Progress Notes (Signed)
Established Patient Office Visit  Subjective:  Patient ID: Ryan James, male    DOB: 1965/09/11  Age: 55 y.o. MRN: [id number]  CC:  Chief Complaint  Patient presents with  . Follow-up    Discuss lbs/testosterone levels.     HPI Ryan James presents for a discussion about possible testosterone replacement therapy for him.  His libido has been in decline over the last 3 years he tells me.  He is quite active physically.  He feels well otherwise.  Total testosterone was 600 Free testosterone levels were on the lower side of normal.  He is aware of this but would still like to try adjunctive testosterone therapy.  Past Medical History:  Diagnosis Date  . Hx of adenomatous colonic polyps 10/25/2018  . Hypertension     Past Surgical History:  Procedure Laterality Date  . COLONOSCOPY    . SHOULDER SURGERY      Family History  Problem Relation Age of Onset  . Colon cancer Mother 19  . Colon polyps Neg Hx   . Esophageal cancer Neg Hx   . Rectal cancer Neg Hx   . Stomach cancer Neg Hx     Social History   Socioeconomic History  . Marital status: Single    Spouse name: Not on file  . Number of children: Not on file  . Years of education: Not on file  . Highest education level: Not on file  Occupational History    Employer: [employer]  Tobacco Use  . Smoking status: Never Smoker  . Smokeless tobacco: Never Used  Vaping Use  . Vaping Use: Never used  Substance and Sexual Activity  . Alcohol use: Yes    Alcohol/week: 6.0 standard drinks    Types: 6 Cans of beer per week    Comment: occasionally  . Drug use: Never  . Sexual activity: Not on file  Other Topics Concern  . Not on file  Social History Narrative  . Not on file   Social Determinants of Health   Financial Resource Strain: Not on file  Food Insecurity: Not on file  Transportation Needs: Not on file  Physical Activity: Not on file  Stress: Not on file  Social Connections: Not on  file  Intimate Partner Violence: Not on file    Outpatient Medications Prior to Visit  Medication Sig Dispense Refill  . amLODipine (NORVASC) 10 MG tablet Take 1 tablet (10 mg total) by mouth daily. 90 tablet 3  . valACYclovir (VALTREX) 1000 MG tablet Take 1 tablet (1,000 mg total) by mouth daily. 90 tablet 3  . tiZANidine (ZANAFLEX) 2 MG tablet Take by mouth. (Patient not taking: Reported on 08/09/2020)    . naproxen (NAPROSYN) 500 MG tablet Take 1 tablet (500 mg total) by mouth 2 (two) times daily. (Patient not taking: Reported on 08/09/2020) 30 tablet 0  . oxyCODONE-acetaminophen (PERCOCET/ROXICET) 5-325 MG tablet Take 1 tablet by mouth every 8 (eight) hours as needed for severe pain. (Patient not taking: Reported on 08/09/2020) 8 tablet 0   No facility-administered medications prior to visit.    No Known Allergies  ROS Review of Systems  Constitutional: Negative.   Respiratory: Negative.   Cardiovascular: Negative.   Gastrointestinal: Negative.   Genitourinary: Negative.       Objective:    Physical Exam Vitals and nursing note reviewed.  Constitutional:      Appearance: Normal appearance.  Eyes:     Conjunctiva/sclera: Conjunctivae normal.  Pulmonary:  Effort: Pulmonary effort is normal.  Neurological:     Mental Status: He is alert and oriented to person, place, and time.  Psychiatric:        Mood and Affect: Mood normal.        Behavior: Behavior normal.     Ht 6\' 1"  (1.854 m)   Wt 231 lb (104.8 kg)   BMI 30.48 kg/m  Wt Readings from Last 3 Encounters:  08/09/20 231 lb (104.8 kg)  07/18/20 233 lb 9.6 oz (106 kg)  08/06/19 231 lb 9.6 oz (105.1 kg)     There are no preventive care reminders to display for this patient.  There are no preventive care reminders to display for this patient.  No results found for: TSH Lab Results  Component Value Date   WBC 7.7 07/18/2020   HGB 14.4 07/18/2020   HCT 43.2 07/18/2020   MCV 91.7 07/18/2020   PLT 150.0  07/18/2020   Lab Results  Component Value Date   NA 140 07/18/2020   K 4.2 07/18/2020   CO2 27 07/18/2020   GLUCOSE 89 07/18/2020   BUN 20 07/18/2020   CREATININE 0.99 07/18/2020   BILITOT 0.6 07/18/2020   ALKPHOS 46 07/18/2020   AST 28 07/18/2020   ALT 33 07/18/2020   PROT 7.3 07/18/2020   ALBUMIN 4.2 07/18/2020   CALCIUM 9.5 07/18/2020   GFR 84.78 07/18/2020   Lab Results  Component Value Date   CHOL 176 07/18/2020   Lab Results  Component Value Date   HDL 61.10 07/18/2020   Lab Results  Component Value Date   LDLCALC 94 07/18/2020   Lab Results  Component Value Date   TRIG 102.0 07/18/2020   Lab Results  Component Value Date   CHOLHDL 3 07/18/2020   No results found for: HGBA1C    The 10-year ASCVD risk score Mikey Bussing DC Jr., et al., 2013) is: 6.5%   Values used to calculate the score:     Age: 34 years     Sex: Male     Is Non-Hispanic African American: No     Diabetic: No     Tobacco smoker: No     Systolic Blood Pressure: 742 mmHg     Is BP treated: Yes     HDL Cholesterol: 61.1 mg/dL     Total Cholesterol: 176 mg/dL Assessment & Plan:   Problem List Items Addressed This Visit   None   Visit Diagnoses    Healthcare maintenance    -  Primary   Relevant Orders   PSA      No orders of the defined types were placed in this encounter.   Follow-up: No follow-ups on file.  Have sent a message to endocrinology for second opinion for this gentleman.  Prostate was enlarged on exam but he has no BPH symptoms.  Patient is aware of erythrocytosis is a side effect.  We will go ahead and add a PSA.  Libby Maw, MD   Virtual Visit via Video Note  I connected with Tana Coast on 08/09/20 at  1:30 PM EDT by a video enabled telemedicine application and verified that I am speaking with the correct person using two identifiers.  Location: Patient: work alone Provider: work   I discussed the limitations of evaluation and management by  telemedicine and the availability of in person appointments. The patient expressed understanding and agreed to proceed.  History of Present Illness:    Observations/Objective:   Assessment and Plan:  Follow Up Instructions:    I discussed the assessment and treatment plan with the patient. The patient was provided an opportunity to ask questions and all were answered. The patient agreed with the plan and demonstrated an understanding of the instructions.   The patient was advised to call back or seek an in-person evaluation if the symptoms worsen or if the condition fails to improve as anticipated.  I provided 20 minutes of non-face-to-face time during this encounter.   Libby Maw, MD

## 2021-01-20 ENCOUNTER — Ambulatory Visit: Payer: BC Managed Care – PPO | Admitting: Family Medicine

## 2021-01-24 ENCOUNTER — Ambulatory Visit: Payer: BC Managed Care – PPO | Admitting: Family Medicine

## 2021-03-27 ENCOUNTER — Ambulatory Visit: Payer: BC Managed Care – PPO | Admitting: Family Medicine

## 2021-04-07 ENCOUNTER — Ambulatory Visit: Payer: BC Managed Care – PPO | Admitting: Family Medicine

## 2021-04-27 ENCOUNTER — Ambulatory Visit: Payer: BC Managed Care – PPO | Admitting: Family Medicine

## 2021-05-05 ENCOUNTER — Other Ambulatory Visit: Payer: Self-pay

## 2021-05-05 ENCOUNTER — Encounter: Payer: Self-pay | Admitting: Family Medicine

## 2021-05-05 ENCOUNTER — Ambulatory Visit (INDEPENDENT_AMBULATORY_CARE_PROVIDER_SITE_OTHER): Payer: BC Managed Care – PPO | Admitting: Family Medicine

## 2021-05-05 VITALS — BP 122/78 | HR 56 | Temp 97.8°F | Ht 73.0 in | Wt 235.2 lb

## 2021-05-05 DIAGNOSIS — N529 Male erectile dysfunction, unspecified: Secondary | ICD-10-CM | POA: Diagnosis not present

## 2021-05-05 DIAGNOSIS — I10 Essential (primary) hypertension: Secondary | ICD-10-CM | POA: Diagnosis not present

## 2021-05-05 DIAGNOSIS — R6882 Decreased libido: Secondary | ICD-10-CM | POA: Insufficient documentation

## 2021-05-05 DIAGNOSIS — E291 Testicular hypofunction: Secondary | ICD-10-CM | POA: Diagnosis not present

## 2021-05-05 DIAGNOSIS — Z0000 Encounter for general adult medical examination without abnormal findings: Secondary | ICD-10-CM | POA: Insufficient documentation

## 2021-05-05 MED ORDER — SILDENAFIL CITRATE 20 MG PO TABS: ORAL_TABLET | ORAL | 0 refills | Status: DC

## 2021-05-05 NOTE — Progress Notes (Signed)
Established Patient Office Visit  Subjective:  Patient ID: Ryan James, male    DOB: 1965-04-20  Age: 56 y.o. MRN: [id number]  CC:  Chief Complaint  Patient presents with   Follow-up    6 month follow up on labs, no concerns. Patient fasting for labs    HPI Ryan James presents for follow-up decreased libido.  Testosterone levels have been low in the past.  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  His significant other lives here.  He is actually from [place].  Also admits some erectile dysfunction.  Blood pressure well controlled with amlodipine denies lower extremity edema.  Past Medical History:  Diagnosis Date   Hx of adenomatous colonic polyps 10/25/2018   Hypertension     Past Surgical History:  Procedure Laterality Date   COLONOSCOPY     SHOULDER SURGERY      Family History  Problem Relation Age of Onset   Colon cancer Mother 54   Colon polyps Neg Hx    Esophageal cancer Neg Hx    Rectal cancer Neg Hx    Stomach cancer Neg Hx     Social History   Socioeconomic History   Marital status: Single    Spouse name: Not on file   Number of children: Not on file   Years of education: Not on file   Highest education level: Not on file  Occupational History    Employer: [employer]  Tobacco Use   Smoking status: Never   Smokeless tobacco: Never  Vaping Use   Vaping Use: Never used  Substance and Sexual Activity   Alcohol use: Yes    Alcohol/week: 6.0 standard drinks    Types: 6 Cans of beer per week    Comment: occasionally   Drug use: Never   Sexual activity: Not on file  Other Topics Concern   Not on file  Social History Narrative   Not on file   Social Determinants of Health   Financial Resource Strain: Not on file  Food Insecurity: Not on file  Transportation Needs: Not on file  Physical Activity: Not on file  Stress: Not on file  Social Connections: Not on file  Intimate Partner  Violence: Not on file    Outpatient Medications Prior to Visit  Medication Sig Dispense Refill   amLODipine (NORVASC) 10 MG tablet Take 1 tablet (10 mg total) by mouth daily. 90 tablet 3   valACYclovir (VALTREX) 1000 MG tablet Take 1 tablet (1,000 mg total) by mouth daily. 90 tablet 3   tiZANidine (ZANAFLEX) 2 MG tablet Take by mouth. (Patient not taking: Reported on 08/09/2020)     No facility-administered medications prior to visit.    No Known Allergies  ROS Review of Systems  Constitutional:  Negative for diaphoresis, fatigue, fever and unexpected weight change.  HENT: Negative.    Eyes:  Negative for photophobia and visual disturbance.  Respiratory: Negative.    Cardiovascular: Negative.   Gastrointestinal: Negative.   Endocrine: Negative for polyphagia and polyuria.  Genitourinary: Negative.   Musculoskeletal:  Negative for gait problem and joint swelling.  Skin:  Negative for color change.  Neurological:  Negative for speech difficulty, weakness and headaches.     Objective:    Physical Exam Vitals and nursing note reviewed.  Constitutional:      General: He is not in acute distress.    Appearance: Normal appearance. He is not ill-appearing, toxic-appearing or diaphoretic.  HENT:  Head: Normocephalic and atraumatic.     Right Ear: External ear normal.     Left Ear: External ear normal.     Mouth/Throat:     Mouth: Mucous membranes are moist.     Pharynx: Oropharynx is clear. No oropharyngeal exudate or posterior oropharyngeal erythema.  Eyes:     General: No scleral icterus.       Right eye: No discharge.        Left eye: No discharge.     Extraocular Movements: Extraocular movements intact.     Conjunctiva/sclera: Conjunctivae normal.     Pupils: Pupils are equal, round, and reactive to light.  Cardiovascular:     Rate and Rhythm: Normal rate and regular rhythm.  Pulmonary:     Effort: Pulmonary effort is normal.     Breath sounds: Normal breath sounds.   Abdominal:     General: Bowel sounds are normal.  Musculoskeletal:     Cervical back: No rigidity or tenderness.  Lymphadenopathy:     Cervical: No cervical adenopathy.  Skin:    General: Skin is warm and dry.  Neurological:     Mental Status: He is alert and oriented to person, place, and time.  Psychiatric:        Mood and Affect: Mood normal.        Behavior: Behavior normal.    BP 122/78 (BP Location: Right Arm, Patient Position: Sitting, Cuff Size: Large)    Pulse (!) 56    Temp 97.8 F (36.6 C) (Temporal)    Ht 6\' 1"  (1.854 m)    Wt 235 lb 3.2 oz (106.7 kg)    SpO2 97%    BMI 31.03 kg/m  Wt Readings from Last 3 Encounters:  05/05/21 235 lb 3.2 oz (106.7 kg)  08/09/20 231 lb (104.8 kg)  07/18/20 233 lb 9.6 oz (106 kg)     There are no preventive care reminders to display for this patient.  There are no preventive care reminders to display for this patient.  No results found for: TSH Lab Results  Component Value Date   WBC 7.7 07/18/2020   HGB 14.4 07/18/2020   HCT 43.2 07/18/2020   MCV 91.7 07/18/2020   PLT 150.0 07/18/2020   Lab Results  Component Value Date   NA 140 07/18/2020   K 4.2 07/18/2020   CO2 27 07/18/2020   GLUCOSE 89 07/18/2020   BUN 20 07/18/2020   CREATININE 0.99 07/18/2020   BILITOT 0.6 07/18/2020   ALKPHOS 46 07/18/2020   AST 28 07/18/2020   ALT 33 07/18/2020   PROT 7.3 07/18/2020   ALBUMIN 4.2 07/18/2020   CALCIUM 9.5 07/18/2020   GFR 84.78 07/18/2020   Lab Results  Component Value Date   CHOL 176 07/18/2020   Lab Results  Component Value Date   HDL 61.10 07/18/2020   Lab Results  Component Value Date   LDLCALC 94 07/18/2020   Lab Results  Component Value Date   TRIG 102.0 07/18/2020   Lab Results  Component Value Date   CHOLHDL 3 07/18/2020   No results found for: HGBA1C    Assessment & Plan:   Problem List Items Addressed This Visit       Cardiovascular and Mediastinum   Essential hypertension   Relevant  Medications   sildenafil (REVATIO) 20 MG tablet     Other   Erectile dysfunction   Relevant Medications   sildenafil (REVATIO) 20 MG tablet   Healthcare maintenance   Relevant  Orders   PSA   Libido, decreased - Primary   Relevant Orders   Ambulatory referral to Endocrinology   Testosterone Total,Free,Bio, Males-([facility])    Meds ordered this encounter  Medications   sildenafil (REVATIO) 20 MG tablet    Sig: May take 3-5 tablets 45 minutes prior to intercourse. No more than 5 tablets daily.    Dispense:  50 tablet    Refill:  0    Follow-up: Return in about 6 months (around 11/02/2021), or if symptoms worsen or fail to improve.    Libby Maw, MD

## 2021-05-06 LAB — TESTOSTERONE TOTAL,FREE,BIO, MALES
Albumin: 4.5 g/dL (ref 3.6–5.1)
Sex Hormone Binding: 46 nmol/L (ref 22–77)
Testosterone, Bioavailable: 126.3 ng/dL (ref 110.0–575.0)
Testosterone, Free: 61.4 pg/mL (ref 46.0–224.0)
Testosterone: 592 ng/dL (ref 250–827)

## 2021-05-06 LAB — PSA: PSA: 0.47 ng/mL (ref <=4.00)

## 2021-06-08 DIAGNOSIS — E291 Testicular hypofunction: Secondary | ICD-10-CM | POA: Insufficient documentation
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
## 2021-09-12 ENCOUNTER — Other Ambulatory Visit: Payer: Self-pay | Admitting: Family Medicine

## 2021-09-12 DIAGNOSIS — B009 Herpesviral infection, unspecified: Secondary | ICD-10-CM

## 2021-09-12 DIAGNOSIS — N529 Male erectile dysfunction, unspecified: Secondary | ICD-10-CM

## 2021-09-12 DIAGNOSIS — I10 Essential (primary) hypertension: Secondary | ICD-10-CM

## 2021-09-12 MED ORDER — SILDENAFIL CITRATE 20 MG PO TABS: ORAL_TABLET | ORAL | 0 refills | Status: AC

## 2021-09-12 MED ORDER — AMLODIPINE BESYLATE 10 MG PO TABS: 10.0000 mg | ORAL_TABLET | Freq: Every day | ORAL | 3 refills | Status: DC

## 2021-09-12 NOTE — Telephone Encounter (Signed)
Refill request for pending Rx last OV 05/05/21 please advise.

## 2021-09-12 NOTE — Telephone Encounter (Signed)
Caller Name: conall vangorder Call back phone #: [phone]  MEDICATION(S): sildenafil (REVATIO) 20 MG tablet [[id number]], amLODipine (NORVASC) 10 MG tablet [[id number]] and valACYclovir (VALTREX) 1000 MG tablet [[id number]   Days of Med Remaining:   Has the patient contacted their pharmacy (YES/NO)?  Yes contact your PCP IF YES, when and what did the pharmacy advise?  IF NO, request that the patient contact the pharmacy for the refills in the future.             The pharmacy will send an electronic request (except for controlled medications).  Preferred Pharmacy: [facility] [address] [facility] [address] [place], [place] [address]

## 2021-11-03 ENCOUNTER — Ambulatory Visit: Payer: BC Managed Care – PPO | Admitting: Family Medicine

## 2022-02-02 ENCOUNTER — Other Ambulatory Visit: Payer: Self-pay | Admitting: Family Medicine

## 2022-02-02 DIAGNOSIS — I10 Essential (primary) hypertension: Secondary | ICD-10-CM

## 2022-02-02 DIAGNOSIS — B009 Herpesviral infection, unspecified: Secondary | ICD-10-CM

## 2022-02-02 MED ORDER — VALACYCLOVIR HCL 1 G PO TABS: 1000.0000 mg | ORAL_TABLET | Freq: Every day | ORAL | 1 refills | Status: AC

## 2022-02-02 MED ORDER — AMLODIPINE BESYLATE 10 MG PO TABS: 10.0000 mg | ORAL_TABLET | Freq: Every day | ORAL | 3 refills | Status: AC

## 2022-02-02 NOTE — Telephone Encounter (Signed)
Refill request for pending Rx last OV 05/05/21, last refill 09/12/21 please advise.

## 2022-02-02 NOTE — Telephone Encounter (Signed)
Caller Name: Helmer Dull Call back phone #: [phone]   MEDICATION(S):  Amlodipine, Valacyclovir  Has the patient contacted their pharmacy (YES/NO)? He is [place] What did pharmacy advise?   Preferred Pharmacy:  [facility] [address] [place] [address] [phone]  ~~~Please advise patient/caregiver to allow 2-3 business days to process RX refills.

## 2023-12-30 ENCOUNTER — Encounter: Payer: Self-pay | Admitting: Internal Medicine
